# Patient Record
Sex: Male | Born: 1975 | Race: White | Hispanic: No | Marital: Married | Smoking: Former smoker
Health system: Southern US, Community
[De-identification: ages and names within clinical notes are randomized; demographics above are authoritative.]

## PROBLEM LIST (undated history)

## (undated) DIAGNOSIS — I10 Essential (primary) hypertension: Secondary | ICD-10-CM

## (undated) DIAGNOSIS — F909 Attention-deficit hyperactivity disorder, unspecified type: Secondary | ICD-10-CM

## (undated) HISTORY — PX: ORTHOPEDIC SURGERY: SHX850

## (undated) HISTORY — DX: Attention-deficit hyperactivity disorder, unspecified type: F90.9

---

## 2009-08-08 ENCOUNTER — Ambulatory Visit (HOSPITAL_BASED_OUTPATIENT_CLINIC_OR_DEPARTMENT_OTHER): Admission: RE | Admit: 2009-08-08 | Discharge: 2009-08-08 | Payer: Self-pay | Admitting: Orthopedic Surgery

## 2011-04-29 ENCOUNTER — Other Ambulatory Visit: Payer: Self-pay | Admitting: Orthopedic Surgery

## 2011-04-29 ENCOUNTER — Ambulatory Visit (HOSPITAL_COMMUNITY)
Admission: RE | Admit: 2011-04-29 | Discharge: 2011-04-29 | Disposition: A | Discharge status: Home / Self Care | Payer: BC Managed Care – PPO | Source: Ambulatory Visit | Attending: Orthopedic Surgery | Admitting: Orthopedic Surgery

## 2011-04-29 DIAGNOSIS — M7989 Other specified soft tissue disorders: Secondary | ICD-10-CM | POA: Insufficient documentation

## 2011-04-29 DIAGNOSIS — M79609 Pain in unspecified limb: Secondary | ICD-10-CM

## 2011-04-29 DIAGNOSIS — R609 Edema, unspecified: Secondary | ICD-10-CM

## 2011-04-30 ENCOUNTER — Other Ambulatory Visit: Payer: Self-pay

## 2012-03-15 ENCOUNTER — Encounter (HOSPITAL_COMMUNITY): Payer: Self-pay | Admitting: *Deleted

## 2012-03-15 ENCOUNTER — Emergency Department (HOSPITAL_COMMUNITY)
Admission: EM | Admit: 2012-03-15 | Discharge: 2012-03-16 | Disposition: A | Discharge status: Home / Self Care | Payer: BC Managed Care – PPO | Attending: Emergency Medicine | Admitting: Emergency Medicine

## 2012-03-15 DIAGNOSIS — R209 Unspecified disturbances of skin sensation: Secondary | ICD-10-CM | POA: Insufficient documentation

## 2012-03-15 DIAGNOSIS — X04XXXA Exposure to ignition of highly flammable material, initial encounter: Secondary | ICD-10-CM | POA: Insufficient documentation

## 2012-03-15 DIAGNOSIS — T23099A Burn of unspecified degree of multiple sites of unspecified wrist and hand, initial encounter: Secondary | ICD-10-CM | POA: Insufficient documentation

## 2012-03-15 DIAGNOSIS — T310 Burns involving less than 10% of body surface: Secondary | ICD-10-CM | POA: Insufficient documentation

## 2012-03-15 DIAGNOSIS — T300 Burn of unspecified body region, unspecified degree: Secondary | ICD-10-CM
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
## 2012-03-16 MED ORDER — SILVER SULFADIAZINE 1 % EX CREA
TOPICAL_CREAM | Freq: Once | CUTANEOUS | Status: AC
  Administered 2012-03-16: 01:00:00 via TOPICAL
  Filled 2012-03-16: qty 50

## 2012-03-16 MED ORDER — HYDROCODONE-ACETAMINOPHEN 5-325 MG PO TABS: 1.0000 | ORAL_TABLET | Freq: Four times a day (QID) | ORAL | Status: AC | PRN

## 2012-03-16 NOTE — Discharge Instructions (Signed)
Use cream on hand for burn pain 4 times a day as needed for pain.

## 2012-03-16 NOTE — ED Provider Notes (Signed)
Medical screening examination/treatment/procedure(s) were performed by non-physician practitioner and as supervising physician I was immediately available for consultation/collaboration.   Hanley Seamen, MD 03/16/12 [phone]
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
## 2012-03-16 NOTE — ED Notes (Signed)
Rx given x1 Pt ambulating independently w/ steady gait on d/c in no acute distress, A&Ox4. D/c instructions reviewed w/ pt - pt denies any further questions or concerns at present.   

## 2019-06-13 ENCOUNTER — Other Ambulatory Visit: Payer: Self-pay | Admitting: Registered"

## 2019-06-13 DIAGNOSIS — Z20822 Contact with and (suspected) exposure to covid-19: Secondary | ICD-10-CM

## 2019-06-15 ENCOUNTER — Telehealth: Payer: Self-pay | Admitting: General Practice

## 2019-06-15 LAB — NOVEL CORONAVIRUS, NAA: SARS-CoV-2, NAA: NOT DETECTED

## 2019-06-15 NOTE — Telephone Encounter (Signed)
°  Neg COVID results given to pt  °

## 2020-07-27 ENCOUNTER — Encounter (HOSPITAL_COMMUNITY): Payer: Self-pay

## 2020-07-27 ENCOUNTER — Other Ambulatory Visit: Payer: Self-pay

## 2020-07-27 ENCOUNTER — Emergency Department (HOSPITAL_COMMUNITY): Payer: No Typology Code available for payment source

## 2020-07-27 ENCOUNTER — Inpatient Hospital Stay (HOSPITAL_COMMUNITY)
Admission: EM | Admit: 2020-07-27 | Discharge: 2020-07-28 | DRG: 502 | Disposition: A | Discharge status: Home / Self Care | Payer: No Typology Code available for payment source | Attending: General Surgery | Admitting: General Surgery

## 2020-07-27 DIAGNOSIS — T1490XA Injury, unspecified, initial encounter: Secondary | ICD-10-CM

## 2020-07-27 DIAGNOSIS — R413 Other amnesia: Secondary | ICD-10-CM | POA: Diagnosis present

## 2020-07-27 DIAGNOSIS — S51012A Laceration without foreign body of left elbow, initial encounter: Secondary | ICD-10-CM | POA: Diagnosis present

## 2020-07-27 DIAGNOSIS — I10 Essential (primary) hypertension: Secondary | ICD-10-CM | POA: Diagnosis present

## 2020-07-27 DIAGNOSIS — S12300A Unspecified displaced fracture of fourth cervical vertebra, initial encounter for closed fracture: Secondary | ICD-10-CM | POA: Diagnosis not present

## 2020-07-27 DIAGNOSIS — S21211A Laceration without foreign body of right back wall of thorax without penetration into thoracic cavity, initial encounter: Secondary | ICD-10-CM

## 2020-07-27 DIAGNOSIS — S0101XA Laceration without foreign body of scalp, initial encounter: Secondary | ICD-10-CM | POA: Diagnosis present

## 2020-07-27 DIAGNOSIS — Z23 Encounter for immunization: Secondary | ICD-10-CM

## 2020-07-27 DIAGNOSIS — Z20822 Contact with and (suspected) exposure to covid-19: Secondary | ICD-10-CM | POA: Diagnosis present

## 2020-07-27 DIAGNOSIS — S41011A Laceration without foreign body of right shoulder, initial encounter: Secondary | ICD-10-CM | POA: Diagnosis present

## 2020-07-27 DIAGNOSIS — F10129 Alcohol abuse with intoxication, unspecified: Secondary | ICD-10-CM | POA: Diagnosis present

## 2020-07-27 DIAGNOSIS — I6502 Occlusion and stenosis of left vertebral artery: Secondary | ICD-10-CM | POA: Diagnosis present

## 2020-07-27 DIAGNOSIS — Y907 Blood alcohol level of 200-239 mg/100 ml: Secondary | ICD-10-CM | POA: Diagnosis present

## 2020-07-27 DIAGNOSIS — S129XXA Fracture of neck, unspecified, initial encounter: Secondary | ICD-10-CM | POA: Diagnosis present

## 2020-07-27 HISTORY — DX: Essential (primary) hypertension: I10

## 2020-07-27 LAB — CBC
HCT: 47.8 % (ref 39.0–52.0)
Hemoglobin: 15.9 g/dL (ref 13.0–17.0)
MCH: 32.3 pg (ref 26.0–34.0)
MCHC: 33.3 g/dL (ref 30.0–36.0)
MCV: 97 fL (ref 80.0–100.0)
Platelets: 378 10*3/uL (ref 150–400)
RBC: 4.93 MIL/uL (ref 4.22–5.81)
RDW: 12.8 % (ref 11.5–15.5)
WBC: 15.7 10*3/uL — ABNORMAL HIGH (ref 4.0–10.5)
nRBC: 0 % (ref 0.0–0.2)

## 2020-07-27 MED ORDER — FENTANYL CITRATE (PF) 100 MCG/2ML IJ SOLN
50.0000 ug | Freq: Once | INTRAMUSCULAR | Status: AC
  Administered 2020-07-27: 50 ug via INTRAVENOUS
  Filled 2020-07-27: qty 2

## 2020-07-27 MED ORDER — TETANUS-DIPHTH-ACELL PERTUSSIS 5-2.5-18.5 LF-MCG/0.5 IM SUSY
0.5000 mL | PREFILLED_SYRINGE | Freq: Once | INTRAMUSCULAR | Status: AC
  Administered 2020-07-27: 0.5 mL via INTRAMUSCULAR
  Filled 2020-07-27: qty 0.5
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
## 2020-07-28 ENCOUNTER — Emergency Department (HOSPITAL_COMMUNITY): Payer: No Typology Code available for payment source

## 2020-07-28 DIAGNOSIS — Z20822 Contact with and (suspected) exposure to covid-19: Secondary | ICD-10-CM | POA: Diagnosis present

## 2020-07-28 DIAGNOSIS — I771 Stricture of artery: Secondary | ICD-10-CM

## 2020-07-28 DIAGNOSIS — R413 Other amnesia: Secondary | ICD-10-CM | POA: Diagnosis present

## 2020-07-28 DIAGNOSIS — Z23 Encounter for immunization: Secondary | ICD-10-CM | POA: Diagnosis not present

## 2020-07-28 DIAGNOSIS — S41011A Laceration without foreign body of right shoulder, initial encounter: Secondary | ICD-10-CM | POA: Diagnosis present

## 2020-07-28 DIAGNOSIS — S51012A Laceration without foreign body of left elbow, initial encounter: Secondary | ICD-10-CM | POA: Diagnosis present

## 2020-07-28 DIAGNOSIS — T1490XA Injury, unspecified, initial encounter: Secondary | ICD-10-CM | POA: Diagnosis present

## 2020-07-28 DIAGNOSIS — F10129 Alcohol abuse with intoxication, unspecified: Secondary | ICD-10-CM | POA: Diagnosis present

## 2020-07-28 DIAGNOSIS — I10 Essential (primary) hypertension: Secondary | ICD-10-CM | POA: Diagnosis present

## 2020-07-28 DIAGNOSIS — S0101XA Laceration without foreign body of scalp, initial encounter: Secondary | ICD-10-CM | POA: Diagnosis present

## 2020-07-28 DIAGNOSIS — I6502 Occlusion and stenosis of left vertebral artery: Secondary | ICD-10-CM | POA: Diagnosis present

## 2020-07-28 DIAGNOSIS — S12300A Unspecified displaced fracture of fourth cervical vertebra, initial encounter for closed fracture: Secondary | ICD-10-CM | POA: Diagnosis present

## 2020-07-28 DIAGNOSIS — S129XXA Fracture of neck, unspecified, initial encounter: Secondary | ICD-10-CM | POA: Diagnosis present

## 2020-07-28 DIAGNOSIS — Y907 Blood alcohol level of 200-239 mg/100 ml: Secondary | ICD-10-CM | POA: Diagnosis present

## 2020-07-28 LAB — COMPREHENSIVE METABOLIC PANEL
ALT: 20 U/L (ref 0–44)
AST: 50 U/L — ABNORMAL HIGH (ref 15–41)
Albumin: 4.5 g/dL (ref 3.5–5.0)
Alkaline Phosphatase: 37 U/L — ABNORMAL LOW (ref 38–126)
Anion gap: 14 (ref 5–15)
BUN: 9 mg/dL (ref 6–20)
CO2: 22 mmol/L (ref 22–32)
Calcium: 9 mg/dL (ref 8.9–10.3)
Chloride: 103 mmol/L (ref 98–111)
Creatinine, Ser: 1.17 mg/dL (ref 0.61–1.24)
GFR, Estimated: >60 mL/min (ref >60)
Glucose, Bld: 130 mg/dL — ABNORMAL HIGH (ref 70–99)
Potassium: 4.3 mmol/L (ref 3.5–5.1)
Sodium: 139 mmol/L (ref 135–145)
Total Bilirubin: 1.2 mg/dL (ref 0.3–1.2)
Total Protein: 7.3 g/dL (ref 6.5–8.1)

## 2020-07-28 LAB — RESPIRATORY PANEL BY RT PCR (FLU A&B, COVID)
Influenza A by PCR: NEGATIVE
Influenza B by PCR: NEGATIVE
SARS Coronavirus 2 by RT PCR: NEGATIVE

## 2020-07-28 LAB — SAMPLE TO BLOOD BANK

## 2020-07-28 LAB — HIV ANTIBODY (ROUTINE TESTING W REFLEX): HIV Screen 4th Generation wRfx: NONREACTIVE

## 2020-07-28 LAB — LACTIC ACID, PLASMA: Lactic Acid, Venous: 4.5 mmol/L (ref 0.5–1.9)

## 2020-07-28 LAB — ETHANOL: Alcohol, Ethyl (B): 234 mg/dL — ABNORMAL HIGH (ref <10)

## 2020-07-28 MED ORDER — OXYCODONE HCL 5 MG PO TABS
5.0000 mg | ORAL_TABLET | ORAL | 0 refills | Status: DC | PRN
Start: 2020-07-28 — End: 2023-03-29

## 2020-07-28 MED ORDER — ONDANSETRON HCL 4 MG/2ML IJ SOLN
4.0000 mg | Freq: Once | INTRAMUSCULAR | Status: AC
  Administered 2020-07-28: 4 mg via INTRAVENOUS
  Filled 2020-07-28: qty 2

## 2020-07-28 MED ORDER — LACTATED RINGERS IV SOLN: INTRAVENOUS | Status: DC

## 2020-07-28 MED ORDER — LACTATED RINGERS IV BOLUS
2000.0000 mL | Freq: Once | INTRAVENOUS | Status: AC
  Administered 2020-07-28: 2000 mL via INTRAVENOUS

## 2020-07-28 MED ORDER — SODIUM CHLORIDE 0.9 % IV SOLN
2.0000 g | Freq: Two times a day (BID) | INTRAVENOUS | Status: DC
  Administered 2020-07-28: 2 g via INTRAVENOUS
  Filled 2020-07-28 (×2): qty 20

## 2020-07-28 MED ORDER — HYDROMORPHONE HCL 1 MG/ML IJ SOLN
1.0000 mg | Freq: Once | INTRAMUSCULAR | Status: AC
  Administered 2020-07-28: 1 mg via INTRAVENOUS
  Filled 2020-07-28: qty 1

## 2020-07-28 MED ORDER — OXYCODONE HCL 5 MG PO TABS
5.0000 mg | ORAL_TABLET | ORAL | Status: DC | PRN
  Administered 2020-07-28 (×3): 10 mg via ORAL
  Filled 2020-07-28 (×3): qty 2

## 2020-07-28 MED ORDER — ONDANSETRON HCL 4 MG/2ML IJ SOLN
4.0000 mg | Freq: Four times a day (QID) | INTRAMUSCULAR | Status: DC | PRN
  Administered 2020-07-28: 4 mg via INTRAVENOUS
  Filled 2020-07-28: qty 2

## 2020-07-28 MED ORDER — LIDOCAINE-EPINEPHRINE 1 %-1:100000 IJ SOLN
20.0000 mL | Freq: Once | INTRAMUSCULAR | Status: AC
  Administered 2020-07-28: 20 mL
  Filled 2020-07-28: qty 1

## 2020-07-28 MED ORDER — IOHEXOL 300 MG/ML  SOLN
100.0000 mL | Freq: Once | INTRAMUSCULAR | Status: AC | PRN
  Administered 2020-07-28: 100 mL via INTRAVENOUS

## 2020-07-28 MED ORDER — LACTATED RINGERS IV BOLUS
1000.0000 mL | Freq: Once | INTRAVENOUS | Status: AC
  Administered 2020-07-28: 1000 mL via INTRAVENOUS

## 2020-07-28 MED ORDER — ASPIRIN EC 81 MG PO TBEC
81.0000 mg | DELAYED_RELEASE_TABLET | Freq: Every day | ORAL | Status: DC
  Administered 2020-07-28: 81 mg via ORAL
  Filled 2020-07-28: qty 1

## 2020-07-28 MED ORDER — IOHEXOL 300 MG/ML  SOLN
75.0000 mL | Freq: Once | INTRAMUSCULAR | Status: AC | PRN
  Administered 2020-07-28: 75 mL via INTRAVENOUS

## 2020-07-28 MED ORDER — ONDANSETRON 4 MG PO TBDP: 4.0000 mg | ORAL_TABLET | Freq: Four times a day (QID) | ORAL | Status: DC | PRN

## 2020-07-28 MED ORDER — ASPIRIN 81 MG PO TBEC
81.0000 mg | DELAYED_RELEASE_TABLET | Freq: Every day | ORAL | 3 refills | Status: DC
Start: 2020-07-29 — End: 2023-03-29

## 2020-07-28 MED ORDER — DOCUSATE SODIUM 100 MG PO CAPS
100.0000 mg | ORAL_CAPSULE | Freq: Two times a day (BID) | ORAL | Status: DC
  Administered 2020-07-28: 100 mg via ORAL
  Filled 2020-07-28: qty 1

## 2020-07-28 MED ORDER — ACETAMINOPHEN 500 MG PO TABS
1000.0000 mg | ORAL_TABLET | Freq: Four times a day (QID) | ORAL | Status: DC
  Administered 2020-07-28 (×3): 1000 mg via ORAL
  Filled 2020-07-28 (×3): qty 2

## 2020-07-28 MED ORDER — MORPHINE SULFATE (PF) 4 MG/ML IV SOLN: 4.0000 mg | INTRAVENOUS | Status: DC | PRN
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
## 2020-07-28 NOTE — Progress Notes (Signed)
Patient arrived to the unit via hospital bed. All belongings are at bedside and wife at bedside. Phone and call light are within reach   07/28/20 0945  Vitals  Temp 97.9 F (36.6 C)  Temp Source Oral  BP 137/82  MAP (mmHg) 97  BP Location Left Arm  BP Method Automatic  Patient Position (if appropriate) Lying  Pulse Rate 78  Level of Consciousness  Level of Consciousness Alert  MEWS COLOR  MEWS Score Color Green  Oxygen Therapy  SpO2 100 %  O2 Device Room Air  Pain Assessment  Pain Scale 0-10  Pain Score 10  Pain Type Acute pain  Pain Location Back  Pain Orientation Mid;Upper  Pain Radiating Towards Neck  Pain Onset Gradual  Patients Stated Pain Goal 0  Pain Intervention(s) Medication (See eMAR)  MEWS Score  MEWS Temp 0  MEWS Systolic 0  MEWS Pulse 0  MEWS RR 1  MEWS LOC 0  MEWS Score 1

## 2020-07-28 NOTE — ED Notes (Signed)
SDU Breakfast Ordered 
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
## 2020-07-28 NOTE — ED Notes (Signed)
Date and time results received: 07/28/20 0120 (use smartphrase ".now" to insert current time)  Test: Lactic Critical Value: 4.5  Name of Provider Notified: [place]  Orders Received? Or Actions Taken?: Continue to monitor patient.

## 2020-07-28 NOTE — Progress Notes (Signed)
Orthopedic Tech Progress Note Patient Details:  Cody Martin 1976-05-29 [id number] Confirmed with nurse that order for cervical collar has been placed. Patient ID: Cody Martin, male   DOB: [birth year], 44 y.o.   MRN: [id number]   Gerald Stabs 07/28/2020, 1:01 PM

## 2020-07-28 NOTE — Progress Notes (Signed)
Pt has been discharged from the unit via wheelchair. Pt has all equipment sent with them. All tele has been disconnected. Both collars has been given to the pt and wife at bedside. Pt has been given and reviewed the AVS documentation. Pt sent in good spirits.
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
## 2020-07-28 NOTE — Progress Notes (Signed)
Orthopedic Tech Progress Note Patient Details:  Cody Martin 1976-07-29 [id number]  Ortho Devices Type of Ortho Device: [place] cervical collar Ortho Device/Splint Interventions: Ordered, Delivered       Gerald Stabs 07/28/2020, 6:51 PM

## 2020-07-28 NOTE — ED Notes (Signed)
Pt to CT by stretcher
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
## 2020-07-29 ENCOUNTER — Encounter (HOSPITAL_COMMUNITY): Payer: Self-pay | Admitting: *Deleted

## 2020-08-08 ENCOUNTER — Ambulatory Visit (INDEPENDENT_AMBULATORY_CARE_PROVIDER_SITE_OTHER): Payer: Self-pay | Admitting: Psychology

## 2020-08-08 DIAGNOSIS — F4323 Adjustment disorder with mixed anxiety and depressed mood: Secondary | ICD-10-CM

## 2020-08-19 ENCOUNTER — Ambulatory Visit: Payer: Self-pay | Admitting: Psychology

## 2020-09-05 ENCOUNTER — Ambulatory Visit (INDEPENDENT_AMBULATORY_CARE_PROVIDER_SITE_OTHER): Payer: Self-pay | Admitting: Psychology

## 2020-09-05 DIAGNOSIS — F4323 Adjustment disorder with mixed anxiety and depressed mood: Secondary | ICD-10-CM

## 2020-10-04 ENCOUNTER — Ambulatory Visit (INDEPENDENT_AMBULATORY_CARE_PROVIDER_SITE_OTHER): Payer: Self-pay | Admitting: Psychology

## 2020-10-04 DIAGNOSIS — F4323 Adjustment disorder with mixed anxiety and depressed mood: Secondary | ICD-10-CM

## 2020-10-05 ENCOUNTER — Ambulatory Visit: Payer: No Typology Code available for payment source | Attending: Internal Medicine

## 2020-10-05 DIAGNOSIS — Z23 Encounter for immunization: Secondary | ICD-10-CM

## 2020-10-15 ENCOUNTER — Other Ambulatory Visit: Payer: Self-pay | Admitting: *Deleted

## 2020-10-15 DIAGNOSIS — I6529 Occlusion and stenosis of unspecified carotid artery: Secondary | ICD-10-CM

## 2020-10-30 ENCOUNTER — Encounter (HOSPITAL_COMMUNITY): Payer: Self-pay

## 2020-10-30 ENCOUNTER — Ambulatory Visit: Payer: Self-pay | Admitting: Vascular Surgery

## 2020-12-11 ENCOUNTER — Encounter: Payer: Self-pay | Admitting: Vascular Surgery

## 2020-12-11 ENCOUNTER — Ambulatory Visit (INDEPENDENT_AMBULATORY_CARE_PROVIDER_SITE_OTHER): Payer: Self-pay | Admitting: Vascular Surgery

## 2020-12-11 ENCOUNTER — Other Ambulatory Visit: Payer: Self-pay

## 2020-12-11 ENCOUNTER — Ambulatory Visit (HOSPITAL_COMMUNITY)
Admission: RE | Admit: 2020-12-11 | Discharge: 2020-12-11 | Disposition: A | Discharge status: Home / Self Care | Payer: Self-pay | Source: Ambulatory Visit | Attending: Vascular Surgery | Admitting: Vascular Surgery

## 2020-12-11 VITALS — BP 124/84 | HR 62 | Temp 97.9°F | Resp 20 | Ht 70.0 in | Wt 210.0 lb

## 2020-12-11 DIAGNOSIS — I6529 Occlusion and stenosis of unspecified carotid artery: Secondary | ICD-10-CM | POA: Insufficient documentation

## 2020-12-11 DIAGNOSIS — S15102D Unspecified injury of left vertebral artery, subsequent encounter: Secondary | ICD-10-CM
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
## 2021-10-22 ENCOUNTER — Encounter: Payer: Self-pay | Admitting: Internal Medicine

## 2021-10-29 ENCOUNTER — Encounter: Payer: Self-pay | Admitting: Internal Medicine

## 2021-11-10 ENCOUNTER — Encounter: Payer: Self-pay | Admitting: Internal Medicine

## 2021-12-09 ENCOUNTER — Encounter: Payer: Self-pay | Admitting: Gastroenterology

## 2021-12-16 ENCOUNTER — Encounter: Payer: Self-pay | Admitting: Gastroenterology

## 2023-01-11 ENCOUNTER — Other Ambulatory Visit: Payer: Self-pay | Admitting: Family Medicine

## 2023-01-11 DIAGNOSIS — Z8249 Family history of ischemic heart disease and other diseases of the circulatory system: Secondary | ICD-10-CM

## 2023-02-17 ENCOUNTER — Ambulatory Visit
Admission: RE | Admit: 2023-02-17 | Discharge: 2023-02-17 | Disposition: A | Discharge status: Home / Self Care | Payer: No Typology Code available for payment source | Source: Ambulatory Visit | Attending: Family Medicine | Admitting: Family Medicine

## 2023-02-17 DIAGNOSIS — Z8249 Family history of ischemic heart disease and other diseases of the circulatory system: Secondary | ICD-10-CM

## 2023-03-26 ENCOUNTER — Other Ambulatory Visit: Payer: Self-pay | Admitting: Orthopaedic Surgery

## 2023-03-29 ENCOUNTER — Other Ambulatory Visit: Payer: Self-pay

## 2023-03-29 ENCOUNTER — Encounter (HOSPITAL_COMMUNITY): Payer: Self-pay | Admitting: Orthopaedic Surgery

## 2023-03-29 NOTE — Pre-Procedure Instructions (Signed)
PCP - Cleatis Polka., MD  EKG - DOS  Anesthesia review: N  Patient verbally denies any shortness of breath, fever, cough and chest pain during phone call   -------------  SDW INSTRUCTIONS given:  Your procedure is scheduled on Tuesday, July 2nd.  Report to [facility] Main Entrance "A" at 0815 A.M., and check in at the Admitting office.  Call this number if you have problems the morning of surgery:  [phone]   Remember:  Do not eat after midnight the night before your surgery  You may drink clear liquids until 0745 the morning of your surgery.   Clear liquids allowed are: Water, Non-Citrus Juices (without pulp), Carbonated Beverages, Clear Tea, Black Coffee Only, and Gatorade    Take these medicines the morning of surgery with A SIP OF WATER  finasteride    As of today, STOP taking any Aspirin (unless otherwise instructed by your surgeon) Aleve, Naproxen, Ibuprofen, Motrin, Advil, Goody's, BC's, all herbal medications, fish oil, and all vitamins.                      Do not wear jewelry, make up, or nail polish            Do not wear lotions, powders, perfumes/colognes, or deodorant.            Do not shave 48 hours prior to surgery.  Men may shave face and neck.            Do not bring valuables to the hospital.            [facility] is not responsible for any belongings or valuables.  Do NOT Smoke (Tobacco/Vaping) 24 hours prior to your procedure If you use a CPAP at night, you may bring all equipment for your overnight stay.   Contacts, glasses, dentures or bridgework may not be worn into surgery.      For patients admitted to the hospital, discharge time will be determined by your treatment team.   Patients discharged the day of surgery will not be allowed to drive home, and someone needs to stay with them for 24 hours.    Special instructions:   [place]- Preparing For Surgery  Before surgery, you can play an important role. Because skin is not  sterile, your skin needs to be as free of germs as possible. You can reduce the number of germs on your skin by washing with CHG (chlorahexidine gluconate) Soap before surgery.  CHG is an antiseptic cleaner which kills germs and bonds with the skin to continue killing germs even after washing.    Oral Hygiene is also important to reduce your risk of infection.  Remember - BRUSH YOUR TEETH THE MORNING OF SURGERY WITH YOUR REGULAR TOOTHPASTE  Please do not use if you have an allergy to CHG or antibacterial soaps. If your skin becomes reddened/irritated stop using the CHG.  Do not shave (including legs and underarms) for at least 48 hours prior to first CHG shower. It is OK to shave your face.  Please follow these instructions carefully.   Shower the NIGHT BEFORE SURGERY and the MORNING OF SURGERY with DIAL Soap.   Pat yourself dry with a CLEAN TOWEL.  Wear CLEAN PAJAMAS to bed the night before surgery  Place CLEAN SHEETS on your bed the night of your first shower and DO NOT SLEEP WITH PETS.   Day of Surgery: Please shower morning of surgery  Wear Clean/Comfortable  clothing the morning of surgery Do not apply any deodorants/lotions.   Remember to brush your teeth WITH YOUR REGULAR TOOTHPASTE.   Questions were answered. Patient verbalized understanding of instructions.

## 2023-03-30 ENCOUNTER — Ambulatory Visit (HOSPITAL_COMMUNITY)
Admission: RE | Admit: 2023-03-30 | Discharge: 2023-03-30 | Disposition: A | Discharge status: Home / Self Care | Payer: Self-pay | Attending: Orthopaedic Surgery | Admitting: Orthopaedic Surgery

## 2023-03-30 ENCOUNTER — Encounter (HOSPITAL_COMMUNITY): Payer: Self-pay | Admitting: Orthopaedic Surgery

## 2023-03-30 ENCOUNTER — Encounter (HOSPITAL_COMMUNITY)
Admission: RE | Disposition: A | Discharge status: Home / Self Care | Payer: Self-pay | Source: Home / Self Care | Attending: Orthopaedic Surgery

## 2023-03-30 ENCOUNTER — Ambulatory Visit (HOSPITAL_COMMUNITY): Payer: Self-pay

## 2023-03-30 ENCOUNTER — Ambulatory Visit (HOSPITAL_BASED_OUTPATIENT_CLINIC_OR_DEPARTMENT_OTHER): Payer: Self-pay

## 2023-03-30 ENCOUNTER — Other Ambulatory Visit: Payer: Self-pay

## 2023-03-30 DIAGNOSIS — S93322A Subluxation of tarsometatarsal joint of left foot, initial encounter: Secondary | ICD-10-CM

## 2023-03-30 DIAGNOSIS — I10 Essential (primary) hypertension: Secondary | ICD-10-CM | POA: Insufficient documentation

## 2023-03-30 DIAGNOSIS — Z87891 Personal history of nicotine dependence: Secondary | ICD-10-CM | POA: Insufficient documentation

## 2023-03-30 DIAGNOSIS — S93622A Sprain of tarsometatarsal ligament of left foot, initial encounter: Secondary | ICD-10-CM

## 2023-03-30 DIAGNOSIS — X58XXXA Exposure to other specified factors, initial encounter: Secondary | ICD-10-CM | POA: Insufficient documentation

## 2023-03-30 DIAGNOSIS — Z79899 Other long term (current) drug therapy: Secondary | ICD-10-CM | POA: Insufficient documentation

## 2023-03-30 DIAGNOSIS — F909 Attention-deficit hyperactivity disorder, unspecified type: Secondary | ICD-10-CM | POA: Insufficient documentation

## 2023-03-30 DIAGNOSIS — S93335A Other dislocation of left foot, initial encounter: Secondary | ICD-10-CM | POA: Insufficient documentation

## 2023-03-30 HISTORY — PX: OPEN REDUCTION INTERNAL FIXATION (ORIF) FOOT LISFRANC FRACTURE: SHX5990

## 2023-03-30 LAB — CBC
HCT: 45 % (ref 39.0–52.0)
Hemoglobin: 15.4 g/dL (ref 13.0–17.0)
MCH: 32 pg (ref 26.0–34.0)
MCHC: 34.2 g/dL (ref 30.0–36.0)
MCV: 93.6 fL (ref 80.0–100.0)
Platelets: 260 10*3/uL (ref 150–400)
RBC: 4.81 MIL/uL (ref 4.22–5.81)
RDW: 12.4 % (ref 11.5–15.5)
WBC: 5.4 10*3/uL (ref 4.0–10.5)
nRBC: 0 % (ref 0.0–0.2)

## 2023-03-30 LAB — BASIC METABOLIC PANEL
Anion gap: 9 (ref 5–15)
BUN: 11 mg/dL (ref 6–20)
CO2: 21 mmol/L — ABNORMAL LOW (ref 22–32)
Calcium: 8.5 mg/dL — ABNORMAL LOW (ref 8.9–10.3)
Chloride: 106 mmol/L (ref 98–111)
Creatinine, Ser: 0.92 mg/dL (ref 0.61–1.24)
GFR, Estimated: >60 mL/min (ref >60)
Glucose, Bld: 97 mg/dL (ref 70–99)
Potassium: 3.8 mmol/L (ref 3.5–5.1)
Sodium: 136 mmol/L (ref 135–145)

## 2023-03-30 SURGERY — OPEN REDUCTION INTERNAL FIXATION (ORIF) FOOT LISFRANC FRACTURE
Anesthesia: General | Site: Foot | Laterality: Left

## 2023-03-30 MED ORDER — PHENYLEPHRINE 80 MCG/ML (10ML) SYRINGE FOR IV PUSH (FOR BLOOD PRESSURE SUPPORT)
PREFILLED_SYRINGE | INTRAVENOUS | Status: AC
  Filled 2023-03-30: qty 10

## 2023-03-30 MED ORDER — DEXAMETHASONE SODIUM PHOSPHATE 10 MG/ML IJ SOLN
INTRAMUSCULAR | Status: DC | PRN
  Administered 2023-03-30: 10 mg via INTRAVENOUS

## 2023-03-30 MED ORDER — FENTANYL CITRATE (PF) 100 MCG/2ML IJ SOLN
INTRAMUSCULAR | Status: AC
  Administered 2023-03-30: 100 ug via INTRAVENOUS
  Filled 2023-03-30: qty 2

## 2023-03-30 MED ORDER — ROCURONIUM BROMIDE 10 MG/ML (PF) SYRINGE
PREFILLED_SYRINGE | INTRAVENOUS | Status: AC
  Filled 2023-03-30: qty 10

## 2023-03-30 MED ORDER — CEFAZOLIN SODIUM 1 G IJ SOLR
INTRAMUSCULAR | Status: AC
  Filled 2023-03-30: qty 20

## 2023-03-30 MED ORDER — FENTANYL CITRATE (PF) 100 MCG/2ML IJ SOLN: 25.0000 ug | INTRAMUSCULAR | Status: DC | PRN

## 2023-03-30 MED ORDER — 0.9 % SODIUM CHLORIDE (POUR BTL) OPTIME
TOPICAL | Status: DC | PRN
  Administered 2023-03-30: 1000 mL

## 2023-03-30 MED ORDER — LACTATED RINGERS IV SOLN: INTRAVENOUS | Status: DC

## 2023-03-30 MED ORDER — MIDAZOLAM HCL 2 MG/2ML IJ SOLN
INTRAMUSCULAR | Status: AC
  Filled 2023-03-30: qty 2

## 2023-03-30 MED ORDER — CEFAZOLIN SODIUM-DEXTROSE 2-3 GM-%(50ML) IV SOLR
INTRAVENOUS | Status: DC | PRN
  Administered 2023-03-30: 2 g via INTRAVENOUS

## 2023-03-30 MED ORDER — LIDOCAINE 2% (20 MG/ML) 5 ML SYRINGE
INTRAMUSCULAR | Status: AC
  Filled 2023-03-30: qty 5

## 2023-03-30 MED ORDER — OXYCODONE HCL 5 MG PO TABS: 5.0000 mg | ORAL_TABLET | ORAL | 0 refills | Status: AC | PRN

## 2023-03-30 MED ORDER — PROPOFOL 10 MG/ML IV BOLUS
INTRAVENOUS | Status: AC
  Filled 2023-03-30: qty 20

## 2023-03-30 MED ORDER — FENTANYL CITRATE (PF) 250 MCG/5ML IJ SOLN
INTRAMUSCULAR | Status: DC | PRN
  Administered 2023-03-30: 125 ug via INTRAVENOUS

## 2023-03-30 MED ORDER — BUPIVACAINE LIPOSOME 1.3 % IJ SUSP
INTRAMUSCULAR | Status: DC | PRN
  Administered 2023-03-30: 10 mL via PERINEURAL

## 2023-03-30 MED ORDER — MIDAZOLAM HCL 2 MG/2ML IJ SOLN
INTRAMUSCULAR | Status: DC | PRN
  Administered 2023-03-30: 2 mg via INTRAVENOUS

## 2023-03-30 MED ORDER — EPHEDRINE SULFATE-NACL 50-0.9 MG/10ML-% IV SOSY
PREFILLED_SYRINGE | INTRAVENOUS | Status: DC | PRN
  Administered 2023-03-30: 12.5 mg via INTRAVENOUS
  Administered 2023-03-30: 7.5 mg via INTRAVENOUS
  Administered 2023-03-30: 5 mg via INTRAVENOUS

## 2023-03-30 MED ORDER — ONDANSETRON HCL 4 MG/2ML IJ SOLN
INTRAMUSCULAR | Status: AC
  Filled 2023-03-30: qty 2

## 2023-03-30 MED ORDER — ASPIRIN 325 MG PO TABS: ORAL_TABLET | ORAL | 0 refills | Status: AC

## 2023-03-30 MED ORDER — DEXAMETHASONE SODIUM PHOSPHATE 10 MG/ML IJ SOLN
INTRAMUSCULAR | Status: AC
  Filled 2023-03-30: qty 1

## 2023-03-30 MED ORDER — ACETAMINOPHEN 500 MG PO TABS
1000.0000 mg | ORAL_TABLET | Freq: Once | ORAL | Status: AC
  Administered 2023-03-30: 1000 mg via ORAL
  Filled 2023-03-30: qty 2

## 2023-03-30 MED ORDER — LIDOCAINE 2% (20 MG/ML) 5 ML SYRINGE
INTRAMUSCULAR | Status: DC | PRN
  Administered 2023-03-30: 100 mg via INTRAVENOUS

## 2023-03-30 MED ORDER — FENTANYL CITRATE (PF) 100 MCG/2ML IJ SOLN: 100.0000 ug | Freq: Once | INTRAMUSCULAR | Status: AC

## 2023-03-30 MED ORDER — ORAL CARE MOUTH RINSE: 15.0000 mL | Freq: Once | OROMUCOSAL | Status: AC

## 2023-03-30 MED ORDER — CHLORHEXIDINE GLUCONATE 0.12 % MT SOLN
15.0000 mL | Freq: Once | OROMUCOSAL | Status: AC
  Administered 2023-03-30: 15 mL via OROMUCOSAL
  Filled 2023-03-30: qty 15

## 2023-03-30 MED ORDER — FENTANYL CITRATE (PF) 250 MCG/5ML IJ SOLN
INTRAMUSCULAR | Status: AC
  Filled 2023-03-30: qty 5

## 2023-03-30 MED ORDER — AMISULPRIDE (ANTIEMETIC) 5 MG/2ML IV SOLN: 10.0000 mg | Freq: Once | INTRAVENOUS | Status: DC | PRN

## 2023-03-30 MED ORDER — ONDANSETRON HCL 4 MG/2ML IJ SOLN
INTRAMUSCULAR | Status: DC | PRN
  Administered 2023-03-30: 4 mg via INTRAVENOUS

## 2023-03-30 MED ORDER — OXYCODONE HCL 5 MG/5ML PO SOLN: 5.0000 mg | Freq: Once | ORAL | Status: DC | PRN

## 2023-03-30 MED ORDER — PROPOFOL 10 MG/ML IV BOLUS
INTRAVENOUS | Status: DC | PRN
  Administered 2023-03-30: 200 mg via INTRAVENOUS

## 2023-03-30 MED ORDER — BUPIVACAINE-EPINEPHRINE (PF) 0.5% -1:200000 IJ SOLN
INTRAMUSCULAR | Status: DC | PRN
  Administered 2023-03-30 (×2): 15 mL via PERINEURAL

## 2023-03-30 MED ORDER — MIDAZOLAM HCL 2 MG/2ML IJ SOLN
INTRAMUSCULAR | Status: AC
  Administered 2023-03-30: 2 mg via INTRAVENOUS
  Filled 2023-03-30: qty 2

## 2023-03-30 MED ORDER — MIDAZOLAM HCL 2 MG/2ML IJ SOLN: 2.0000 mg | Freq: Once | INTRAMUSCULAR | Status: AC

## 2023-03-30 MED ORDER — OXYCODONE HCL 5 MG PO TABS: 5.0000 mg | ORAL_TABLET | Freq: Once | ORAL | Status: DC | PRN

## 2023-03-30 SURGICAL SUPPLY — 60 items
APL PRP STRL LF DISP 70% ISPRP (MISCELLANEOUS)
APL SKNCLS STERI-STRIP NONHPOA (GAUZE/BANDAGES/DRESSINGS)
BAG COUNTER SPONGE SURGICOUNT (BAG) IMPLANT
BAG SPNG CNTER NS LX DISP (BAG)
BENZOIN TINCTURE PRP APPL 2/3 (GAUZE/BANDAGES/DRESSINGS) IMPLANT
BIT DRILL 2.5 STRGHT CANN (BIT) IMPLANT
BNDG CMPR 9X4 STRL LF SNTH (GAUZE/BANDAGES/DRESSINGS) ×1
BNDG CMPR MED 10X6 ELC LF (GAUZE/BANDAGES/DRESSINGS) ×1
BNDG COHESIVE 4X5 TAN STRL (GAUZE/BANDAGES/DRESSINGS) IMPLANT
BNDG ELASTIC 4X5.8 VLCR STR LF (GAUZE/BANDAGES/DRESSINGS) ×2 IMPLANT
BNDG ELASTIC 6X10 VLCR STRL LF (GAUZE/BANDAGES/DRESSINGS) IMPLANT
BNDG ELASTIC 6X5.8 VLCR STR LF (GAUZE/BANDAGES/DRESSINGS) IMPLANT
BNDG ESMARK 4X9 LF (GAUZE/BANDAGES/DRESSINGS) ×1 IMPLANT
CHLORAPREP W/TINT 26 (MISCELLANEOUS) ×1 IMPLANT
CUFF TOURN SGL QUICK 34 (TOURNIQUET CUFF) ×1
CUFF TRNQT CYL 34X4.125X (TOURNIQUET CUFF) IMPLANT
DRAPE C-ARMOR (DRAPES) IMPLANT
DRAPE IMP U-DRAPE 54X76 (DRAPES) ×1 IMPLANT
DRAPE OEC MINIVIEW 54X84 (DRAPES) ×1 IMPLANT
DRAPE U-SHAPE 47X51 STRL (DRAPES) ×1 IMPLANT
DRSG XEROFORM 1X8 (GAUZE/BANDAGES/DRESSINGS) IMPLANT
ELECT REM PT RETURN 9FT ADLT (ELECTROSURGICAL) ×1
ELECTRODE REM PT RTRN 9FT ADLT (ELECTROSURGICAL) ×1 IMPLANT
GAUZE SPONGE 4X4 12PLY STRL (GAUZE/BANDAGES/DRESSINGS) ×1 IMPLANT
GAUZE XEROFORM 1X8 LF (GAUZE/BANDAGES/DRESSINGS) ×1 IMPLANT
GLOVE BIO SURGEON STRL SZ7.5 (GLOVE) ×1 IMPLANT
GLOVE BIOGEL PI IND STRL 8 (GLOVE) ×1 IMPLANT
GOWN STRL REUS W/ TWL LRG LVL3 (GOWN DISPOSABLE) ×1 IMPLANT
GOWN STRL REUS W/ TWL XL LVL3 (GOWN DISPOSABLE) ×1 IMPLANT
GOWN STRL REUS W/TWL LRG LVL3 (GOWN DISPOSABLE) ×1
GOWN STRL REUS W/TWL XL LVL3 (GOWN DISPOSABLE) ×1
GUIDEWIRE 1.6 (WIRE) ×1
GUIDEWIRE ORTH 157X1.6XTROC (WIRE) IMPLANT
K-WIRE TROCAR 1.35 (MISCELLANEOUS) ×2
KIT BASIN OR (CUSTOM PROCEDURE TRAY) ×1 IMPLANT
KWIRE TROCAR 1.35 (MISCELLANEOUS) IMPLANT
LOOP VASCLR MAXI BLUE 18IN ST (MISCELLANEOUS) IMPLANT
LOOP VASCULAR MAXI 18 BLUE (MISCELLANEOUS) ×1
LOOPS VASCLR MAXI BLUE 18IN ST (MISCELLANEOUS) ×1 IMPLANT
NS IRRIG 1000ML POUR BTL (IV SOLUTION) ×1 IMPLANT
PACK ORTHO EXTREMITY (CUSTOM PROCEDURE TRAY) ×2 IMPLANT
PAD CAST 4YDX4 CTTN HI CHSV (CAST SUPPLIES) ×1 IMPLANT
PADDING CAST COTTON 4X4 STRL (CAST SUPPLIES) ×1
PADDING CAST SYNTHETIC 4X4 STR (CAST SUPPLIES) ×1 IMPLANT
SCREW LP TIT 3.5X36 (Screw) IMPLANT
SPIKE FLUID TRANSFER (MISCELLANEOUS) IMPLANT
SPLINT PLASTER CAST FAST 5X30 (CAST SUPPLIES) IMPLANT
SPONGE T-LAP 18X18 ~~LOC~~+RFID (SPONGE) IMPLANT
STRIP CLOSURE SKIN 1/2X4 (GAUZE/BANDAGES/DRESSINGS) IMPLANT
SUT ETHILON 3 0 PS 1 (SUTURE) ×1 IMPLANT
SUT FIBERWIRE 2-0 18 17.9 3/8 (SUTURE)
SUT MNCRL AB 3-0 PS2 18 (SUTURE) ×1 IMPLANT
SUT PDS AB 2-0 CT2 27 (SUTURE) ×1 IMPLANT
SUT VIC AB 2-0 CT2 27 (SUTURE) IMPLANT
SUT VIC AB 3-0 FS2 27 (SUTURE) IMPLANT
SUTURE FIBERWR 2-0 18 17.9 3/8 (SUTURE) IMPLANT
TOWEL GREEN STERILE FF (TOWEL DISPOSABLE) ×2 IMPLANT
TUBE CONNECTING 20X1/4 (TUBING) ×1 IMPLANT
UNDERPAD 30X36 HEAVY ABSORB (UNDERPADS AND DIAPERS) ×1 IMPLANT
VASCULAR TIE MAXI BLUE 18IN ST (MISCELLANEOUS) ×1
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
## 2023-03-30 NOTE — Anesthesia Postprocedure Evaluation (Signed)
Anesthesia Post Note  Patient: Cody Martin  Procedure(s) Performed: OPEN TREATMENT OF LEFT LISFRANC AND SECOND TARSOMETATARSAL JOINT (Left: Foot)     Patient location during evaluation: PACU Anesthesia Type: General Level of consciousness: awake and alert Pain management: pain level controlled Vital Signs Assessment: post-procedure vital signs reviewed and stable Respiratory status: spontaneous breathing, nonlabored ventilation and respiratory function stable Cardiovascular status: blood pressure returned to baseline Postop Assessment: no apparent nausea or vomiting Anesthetic complications: no   No notable events documented.  Last Vitals:  Vitals:   03/30/23 1200 03/30/23 1215  BP: (!) 140/95 (!) 139/91  Pulse: 80 79  Resp: 13 (!) 21  Temp:  36.4 C  SpO2: 98% 98%    Last Pain:  Vitals:   03/30/23 1215  TempSrc:   PainSc: 0-No pain    LLE Motor Response: Purposeful movement (03/30/23 1215) LLE Sensation: No sensation (absent) (03/30/23 1215)          Shanda Howells

## 2023-03-30 NOTE — Anesthesia Procedure Notes (Signed)
Anesthesia Regional Block: Popliteal block   Pre-Anesthetic Checklist: , timeout performed,  Correct Patient, Correct Site, Correct Laterality,  Correct Procedure, Correct Position, site marked,  Risks and benefits discussed,  Pre-op evaluation,  At surgeon's request and post-op pain management  Laterality: Left  Prep: Maximum Sterile Barrier Precautions used, chloraprep       Needles:  Injection technique: Single-shot  Needle Type: Echogenic Stimulator Needle     Needle Length: 9cm  Needle Gauge: 22     Additional Needles:   Procedures:,,,, ultrasound used (permanent image in chart),,    Narrative:  Start time: 03/30/2023 9:39 AM End time: 03/30/2023 9:42 AM Injection made incrementally with aspirations every 5 mL.  Performed by: Personally  Anesthesiologist: Kaylyn Layer, MD  Additional Notes: Risks, benefits, and alternative discussed. Patient gave consent for procedure. Patient prepped and draped in sterile fashion. Sedation administered, patient remains easily responsive to voice. Relevant anatomy identified with ultrasound guidance. Local anesthetic given in 5cc increments with no signs or symptoms of intravascular injection. No pain or paraesthesias with injection. Patient monitored throughout procedure with signs of LAST or immediate complications. Tolerated well. Ultrasound image placed in chart.  Amalia Greenhouse, MD

## 2023-03-30 NOTE — Anesthesia Procedure Notes (Addendum)
Procedure Name: LMA Insertion Date/Time: 03/30/2023 10:17 AM  Performed by: Darlina Guys, CRNAPre-anesthesia Checklist: Patient identified, Emergency Drugs available, Suction available and Patient being monitored Patient Re-evaluated:Patient Re-evaluated prior to induction Oxygen Delivery Method: Circle System Utilized Preoxygenation: Pre-oxygenation with 100% oxygen Induction Type: IV induction Ventilation: Mask ventilation without difficulty LMA: LMA inserted LMA Size: 4.0 Number of attempts: 1 Airway Equipment and Method: Bite block Placement Confirmation: positive ETCO2 Tube secured with: Tape Dental Injury: Teeth and Oropharynx as per pre-operative assessment

## 2023-03-30 NOTE — Anesthesia Preprocedure Evaluation (Addendum)
Anesthesia Evaluation  Patient identified by MRN, date of birth, ID band Patient awake    Reviewed: Allergy & Precautions, NPO status , Patient's Chart, lab work & pertinent test results  History of Anesthesia Complications Negative for: history of anesthetic complications  Airway Mallampati: II  TM Distance: >3 FB Neck ROM: Full    Dental no notable dental hx.    Pulmonary former smoker   Pulmonary exam normal        Cardiovascular hypertension, Pt. on medications Normal cardiovascular exam     Neuro/Psych        ADHD   GI/Hepatic negative GI ROS, Neg liver ROS,,,  Endo/Other  negative endocrine ROS    Renal/GU negative Renal ROS     Musculoskeletal LEFT FOOT LISFRANC DISRUPTION, SECOND TARSOMETATARSAL JOINT SUBLUXATION   Abdominal   Peds  Hematology negative hematology ROS (+)   Anesthesia Other Findings Day of surgery medications reviewed with patient.  Reproductive/Obstetrics                              Anesthesia Physical Anesthesia Plan  ASA: 2  Anesthesia Plan: General   Post-op Pain Management: Tylenol PO (pre-op)* and Regional block*   Induction: Intravenous  PONV Risk Score and Plan: 2 and Treatment may vary due to age or medical condition, Ondansetron, Dexamethasone and Midazolam  Airway Management Planned: LMA  Additional Equipment: None  Intra-op Plan:   Post-operative Plan: Extubation in OR  Informed Consent: I have reviewed the patients History and Physical, chart, labs and discussed the procedure including the risks, benefits and alternatives for the proposed anesthesia with the patient or authorized representative who has indicated his/her understanding and acceptance.     Dental advisory given  Plan Discussed with: CRNA  Anesthesia Plan Comments:         Anesthesia Quick Evaluation

## 2023-03-30 NOTE — Transfer of Care (Signed)
Immediate Anesthesia Transfer of Care Note  Patient: Cody Martin  Procedure(s) Performed: OPEN TREATMENT OF LEFT LISFRANC AND SECOND TARSOMETATARSAL JOINT (Left: Foot)  Patient Location: PACU  Anesthesia Type:General  Level of Consciousness: awake and patient cooperative  Airway & Oxygen Therapy: Patient Spontanous Breathing and Patient connected to face mask oxygen  Post-op Assessment: Report given to RN and Post -op Vital signs reviewed and stable  Post vital signs: Reviewed and stable  Last Vitals:  Vitals Value Taken Time  BP 121/68 03/30/23 1141  Temp    Pulse 83 03/30/23 1142  Resp 15 03/30/23 1142  SpO2 100 % 03/30/23 1142  Vitals shown include unvalidated device data.  Last Pain:  Vitals:   03/30/23 0903  TempSrc:   PainSc: 5       Patients Stated Pain Goal: 1 (03/30/23 0903)  Complications: No notable events documented.

## 2023-03-30 NOTE — Anesthesia Procedure Notes (Signed)
Anesthesia Regional Block: Adductor canal block   Pre-Anesthetic Checklist: , timeout performed,  Correct Patient, Correct Site, Correct Laterality,  Correct Procedure, Correct Position, site marked,  Risks and benefits discussed,  Pre-op evaluation,  At surgeon's request and post-op pain management  Laterality: Left  Prep: Maximum Sterile Barrier Precautions used, chloraprep       Needles:  Injection technique: Single-shot  Needle Type: Echogenic Stimulator Needle     Needle Length: 9cm  Needle Gauge: 22     Additional Needles:   Procedures:,,,, ultrasound used (permanent image in chart),,    Narrative:  Start time: 03/30/2023 9:36 AM End time: 03/30/2023 9:39 AM Injection made incrementally with aspirations every 5 mL.  Performed by: Personally  Anesthesiologist: Kaylyn Layer, MD  Additional Notes: Risks, benefits, and alternative discussed. Patient gave consent for procedure. Patient prepped and draped in sterile fashion. Sedation administered, patient remains easily responsive to voice. Relevant anatomy identified with ultrasound guidance. Local anesthetic given in 5cc increments with no signs or symptoms of intravascular injection. No pain or paraesthesias with injection. Patient monitored throughout procedure with signs of LAST or immediate complications. Tolerated well. Ultrasound image placed in chart.  Cody Greenhouse, MD

## 2023-03-30 NOTE — Discharge Instructions (Signed)

## 2023-04-02 ENCOUNTER — Encounter (HOSPITAL_COMMUNITY): Payer: Self-pay | Admitting: Orthopaedic Surgery

## 2023-04-16 NOTE — Op Note (Signed)
Cody Martin male 47 y.o. 03/30/2023  PreOperative Diagnosis: Left foot second tarsometatarsal joint subluxation Left foot Lisfranc joint dislocation  PostOperative Diagnosis: same  PROCEDURE: Open reduction internal fixation of left second tarsal metatarsal joint Open reduction internal fixation of Lisfranc joint  SURGEON: Dub Mikes, MD  ASSISTANT: Jesse [place], PA-C was necessary for patient positioning, prep, drape, assistance with joint reduction and placement of hardware  ANESTHESIA: General LMA anesthesia with peripheral nerve block  FINDINGS: Disrupted second tarsometatarsal joint and Lisfranc joint  IMPLANTS: Arthrex 3.5 mm fully threaded screw  INDICATIONS:47 y.o. malesustained the above injury.  He had x-ray and advanced imaging evidence of disruption of the Lisfranc joint and second tarsal metatarsal joint.  He was indicated for surgery.   Patient understood the risks, benefits and alternatives to surgery which include but are not limited to wound healing complications, infection, nonunion, malunion, need for further surgery as well as damage to surrounding structures. They also understood the potential for continued pain in that there were no guarantees of acceptable outcome After weighing these risks the patient opted to proceed with surgery.  PROCEDURE: Patient was identified in the preoperative holding area.  The left foot was marked by myself.  Consent was signed by myself and the patient.  Block was performed by anesthesia in the preoperative holding area.  Patient was taken to the operative suite and placed supine on the operative table.  General LMA anesthesia was induced without difficulty. Bump was placed under the operative hip and bone foam was used.  All bony prominences were well padded.  Tourniquet was placed on the operative thigh.  Preoperative antibiotics were given. The extremity was prepped and draped in the usual sterile fashion and surgical  timeout was performed.  The limb was elevated and the tourniquet was inflated to 250 mmHg.   We began by making a longitudinal incision overlying the second tarsometatarsal joint.  This was taken sharply down through skin and subcutaneous tissue.  Blunt dissection was used to mobilize the skin medially and laterally.  There were a few branches of the superior peroneal nerve in the field which were protected through the entirety of the case.  Then the fascia overlying the extensor hallucis brevis muscle belly was incised in line with the incision.  The muscle belly was mobilized.  The tendon sheath was incised distally along the line of the tendon to allow for retraction.  The muscle was retracted and held with Carollee Massed.  Then the neurovascular bundle was identified.  An incision was made medially and laterally to the neurovascular bundle and it was elevated in a subperiosteal fashion.  A vessel loop was placed around it to protected through the entirety of the case which was done.  Then the second tarsometatarsal joint was identified.  There was frank instability of the Lisfranc joint between the second metatarsal base and the medial cuneiform.  There was hematoma and consolidated fibrous tissue within this area.  The intercuneiform joint was inspected.  There was gross instability of the second tarsometatarsal joint.  A Glorious Peach was placed within the intercuneiform joint and the Lisfranc joint there was good stability.  Using a rongeur the fibrous tissue within the Lisfranc joint was cleared.    Open reduction internal fixation of second tarsometatarsal joint: We then proceeded to inspect the second tarsometatarsal joint.  There was continued dorsal subluxation of the second metatarsal base in relationship to the middle cuneiform.  Using a freer elevator the second metatarsal base was  reduced into a better position under direct visualization.  Then a K wire was placed from the base of the second metatarsal  across the second tarsometatarsal joint stabilizing the joint provisionally. Then with this joint held reduced a pointed reduction forceps was used to address the Lisfranc joint.  Open reduction internal fixation of the Lisfranc joint: Using a pointed reduction forceps from the base of the second metatarsal to the medial cuneiform we are able to reduce the Lisfranc joint in a near anatomic position.  Fluoroscopy confirmed acceptable reduction of the intercuneiform joints, second tarsometatarsal joint and Lisfranc joint.  Then a K wire was placed from the second metatarsal base across the Lisfranc joint into the medial cuneiform.  Then appropriate position was confirmed fluoroscopically.  Then we proceeded to drill across the K wire in place a solid fully threaded screw across the Lisfranc joint.  After this the K wires and clamp were removed and there was maintenance of stability and reduction of the intercuneiform joint, second tarsometatarsal joint and Lisfranc joint.  We then inspected the first tarsometatarsal joint.  The first TMT joint was found to be stable.  The wound was irrigated with normal saline.  The neurovascular bundle was inspected and found to be without injury.  Then the retinacular tissue was closed using 3-0 Monocryl suture.  Skin and subcutaneous tissue was closed using 3-0 Monocryl and 3-0 nylon suture.  Tourniquet was released.  She was placed in a short leg nonweightbearing splint.  Patient was then awakened from anesthesia and taken to recovery in stable condition.  No complications.  Counts were correct.    POST OPERATIVE INSTRUCTIONS: Nonweightbearing operative externally Follow-up in 2 weeks for splint removal, suture removal and placement of a short walking boot   TOURNIQUET TIME:less than 2 hours  BLOOD LOSS:  Minimal         DRAINS: none         SPECIMEN: none       COMPLICATIONS:  * No complications entered in OR log *         Disposition: PACU -  hemodynamically stable.         Condition: stable
